# Patient Record
Sex: Female | Born: 1980 | Marital: Married
Health system: Southern US, Community
[De-identification: ages and names within clinical notes are randomized; demographics above are authoritative.]

---

## 2014-01-01 ENCOUNTER — Other Ambulatory Visit: Payer: Self-pay | Admitting: Family Medicine

## 2014-01-01 ENCOUNTER — Ambulatory Visit
Admission: RE | Admit: 2014-01-01 | Discharge: 2014-01-01 | Disposition: A | Discharge status: Home / Self Care | Payer: 59 | Source: Ambulatory Visit | Attending: Family Medicine | Admitting: Family Medicine

## 2014-01-01 DIAGNOSIS — R300 Dysuria: Secondary | ICD-10-CM

## 2015-02-18 IMAGING — CT CT ABD-PELV W/O CM
3 of 4 series · 10 of 46 positions shown, 17 images · non-contrast
Comparison: None.

CLINICAL DATA: Dysuria and multiple urinary tract infections.

EXAM:
CT ABDOMEN AND PELVIS WITHOUT CONTRAST
TECHNIQUE: Multidetector CT imaging of the abdomen and pelvis was performed
following the standard protocol without intravenous contrast.

[Series 3: lung windows · axial · 0.59mm/px · z∈[-85,-0]mm · 6 of 25 slices shown, 11 images]
[im 4/25  soft-tissue]
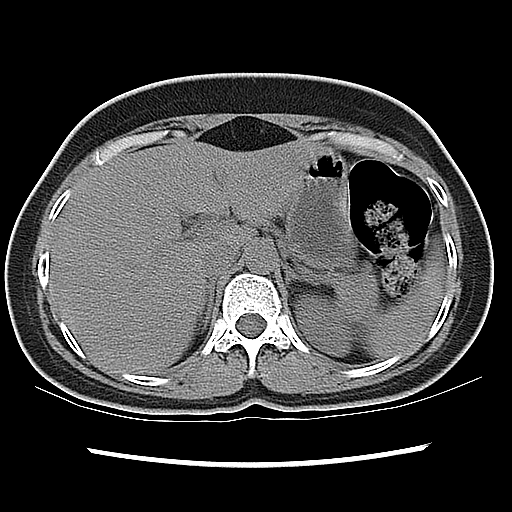
[im 4/25  bone]
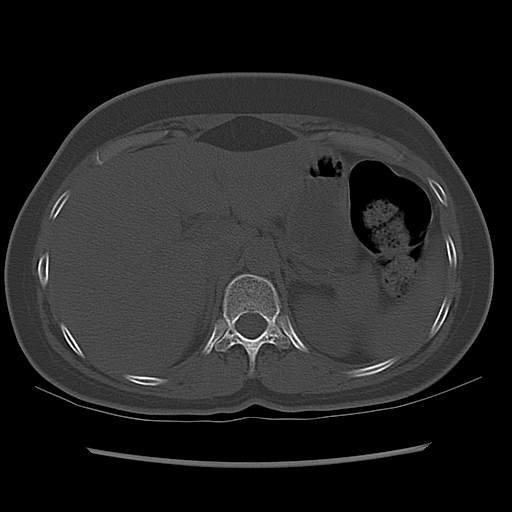
[im 7/25  soft-tissue]
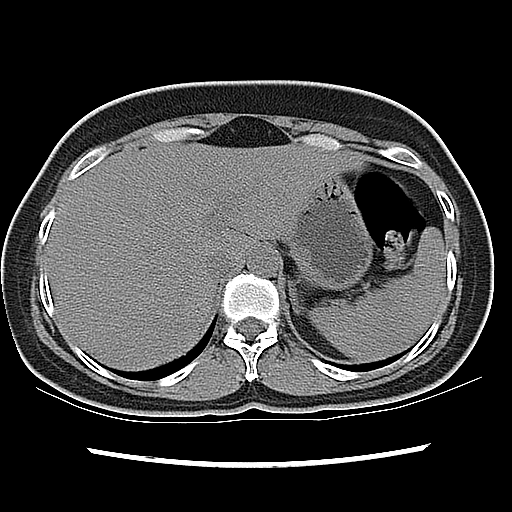
[im 11/25  soft-tissue]
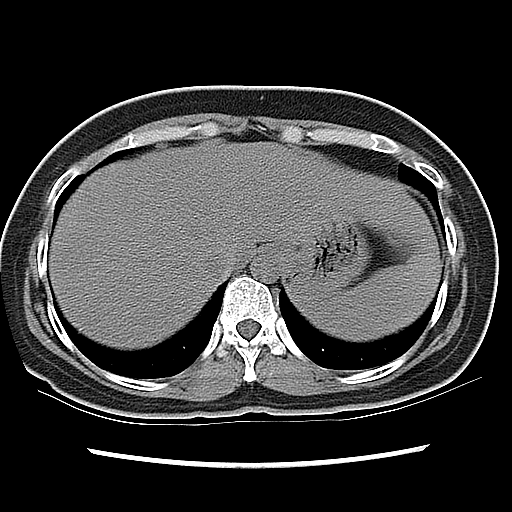
[im 11/25  lung]
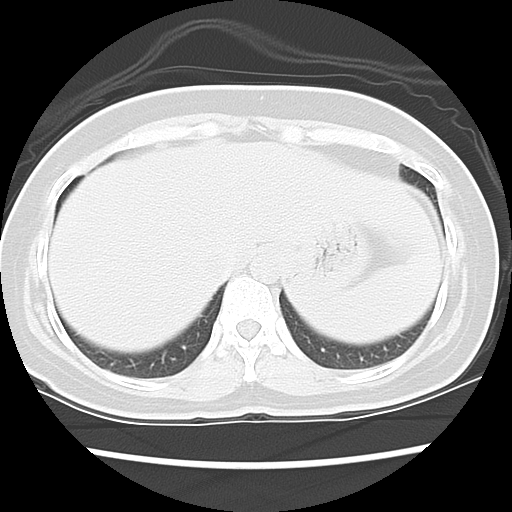
[im 14/25  soft-tissue]
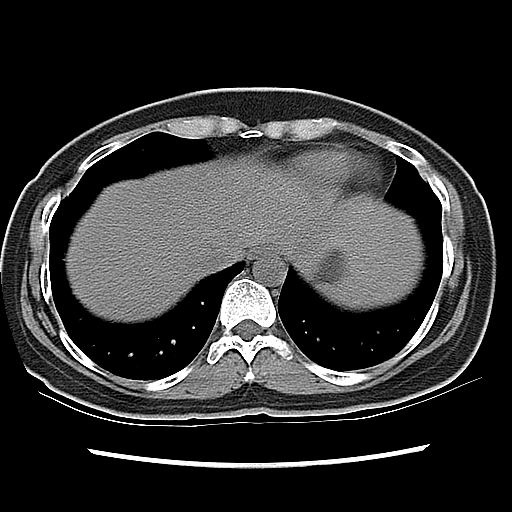
[im 14/25  lung]
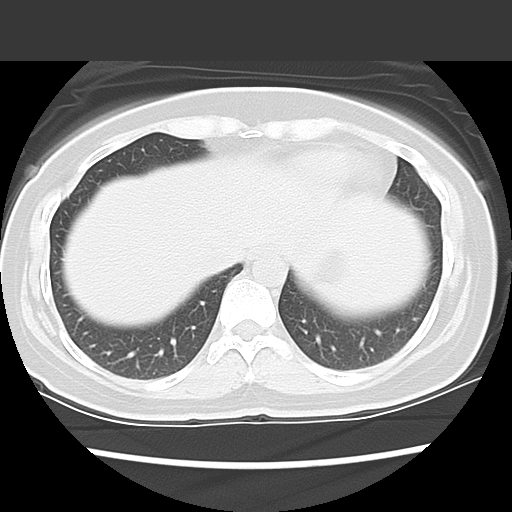
[im 18/25  soft-tissue]
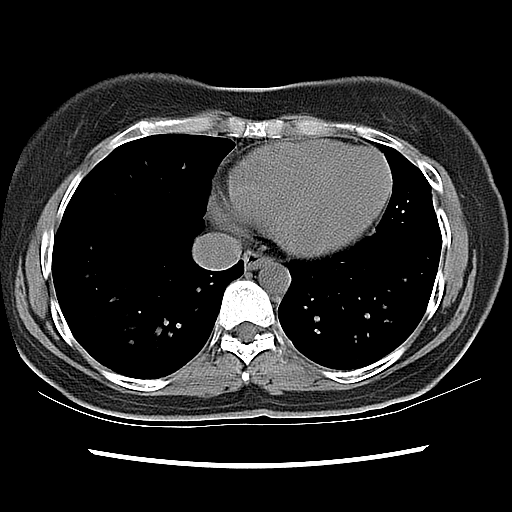
[im 18/25  lung]
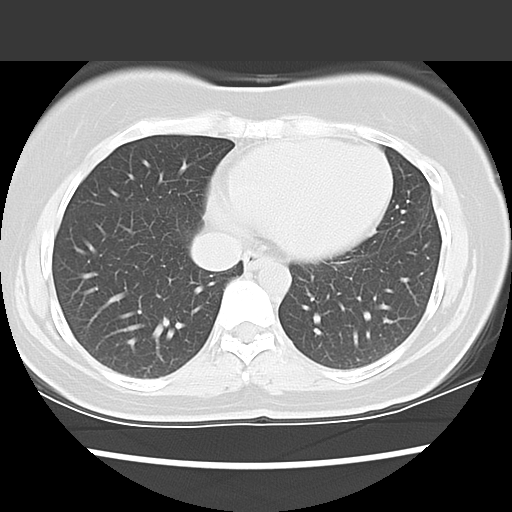
[im 21/25  soft-tissue]
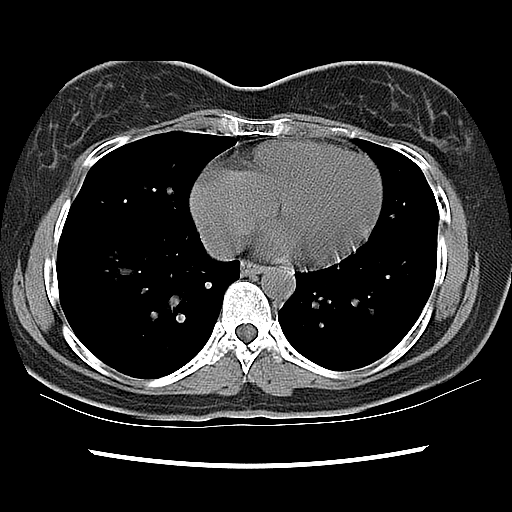
[im 21/25  lung]
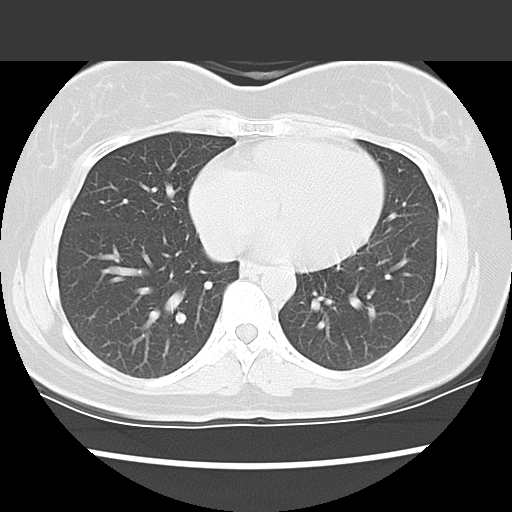

[Series 400: cor · coronal · 0.91mm/px · 3 of 100 slices shown, 4 images]
[im 34/100  soft-tissue]
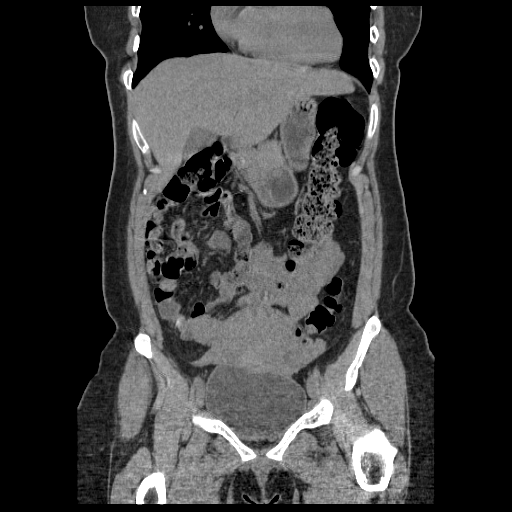
[im 45/100  soft-tissue]
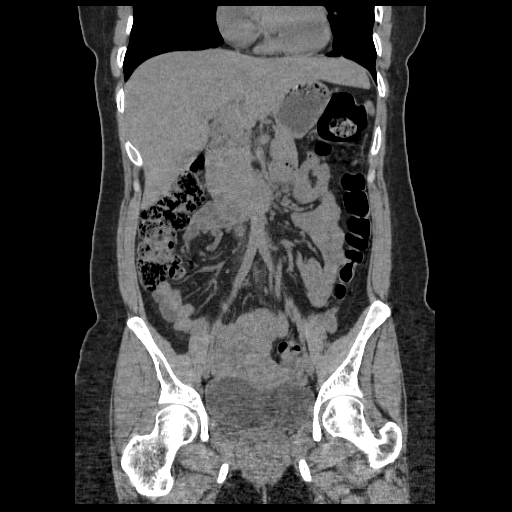
[im 45/100  bone]
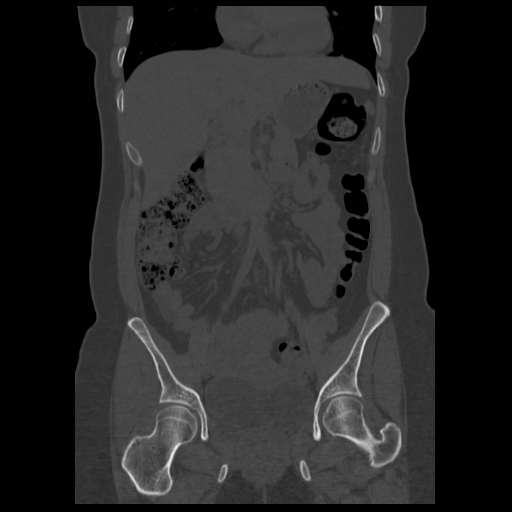
[im 56/100  soft-tissue]
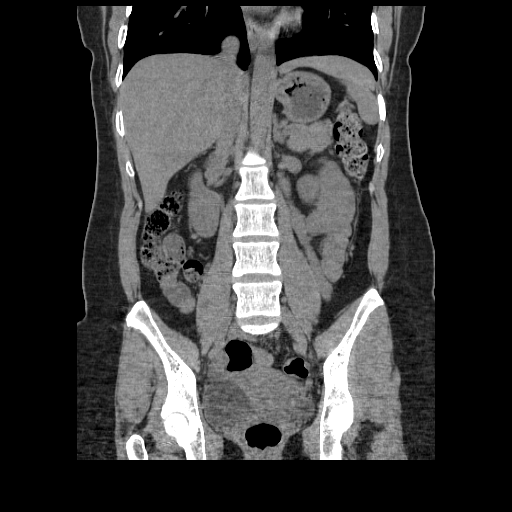

[Series 401: sag · sagittal · 0.91mm/px · 1 of 139 slices shown, 2 images]
[im 47/139  soft-tissue]
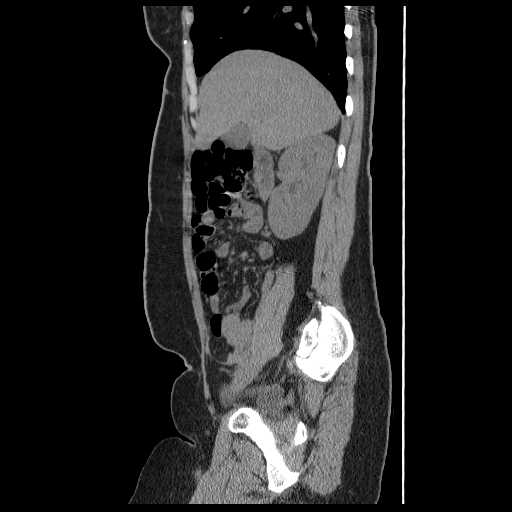
[im 47/139  bone]
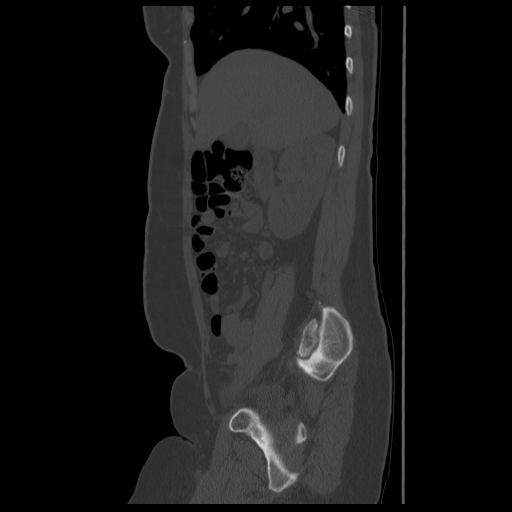

[10 of 46 positions shown; findings below may reference images not displayed]

FINDINGS: Question a 3 mm nodule versus ill-defined vessels at the right
middle lobe on sequence 3, image 10. There is a 3 mm nodule along
the periphery of the right lower lobe on image 13. No evidence for
free air.

Normal appearance of the liver, gallbladder, spleen, pancreas and
adrenal glands. Normal appearance of the kidneys without stones or
hydronephrosis. Fluid in the urinary bladder without stones.

Abnormal contour of the anterior uterus on sequence 2, image 64. The
tissue is slightly exophytic, measuring up to 1.3 cm. This finding
is most likely related to previous cesarean section. In addition,
there is mild stranding in this area. The uterus is draped over the
dome of the urinary bladder. Limited evaluation of the uterus and
adnexa tissue on this non contrast examination. No significant free
fluid or lymphadenopathy. Normal appearance of the appendix. No
gross abnormality to the small or large bowel.

No acute bone abnormality.
IMPRESSION: Negative for kidney stones or hydronephrosis.

Abnormal appearance of the anterior uterus is likely related to
previous cesarean section.

Two punctate nodules at the right lung base. These are likely
incidental findings in a patient of this age. If the patient is at
high risk for bronchogenic carcinoma, follow-up chest CT at 1 year
is recommended. If the patient is at low risk, no follow-up is
needed. This recommendation follows the consensus statement:
Guidelines for Management of Small Pulmonary Nodules Detected on CT
Scans: A Statement from the [HOSPITAL] as published in
